# Patient Record
Sex: Male | Born: 1980 | Race: White | Hispanic: No | Marital: Married | Smoking: Smoker, current status unknown
Health system: Southern US, Community
[De-identification: ages and names within clinical notes are randomized; demographics above are authoritative.]

## PROBLEM LIST (undated history)

## (undated) DIAGNOSIS — G4730 Sleep apnea, unspecified: Secondary | ICD-10-CM

---

## 2016-12-30 ENCOUNTER — Inpatient Hospital Stay (HOSPITAL_COMMUNITY): Payer: No Typology Code available for payment source | Admitting: Anesthesiology

## 2016-12-30 ENCOUNTER — Emergency Department (HOSPITAL_COMMUNITY): Payer: No Typology Code available for payment source

## 2016-12-30 ENCOUNTER — Inpatient Hospital Stay (HOSPITAL_COMMUNITY)
Admission: EM | Admit: 2016-12-30 | Discharge: 2017-01-07 | DRG: 480 | Disposition: A | Discharge status: Home Health | Payer: No Typology Code available for payment source | Attending: Orthopedic Surgery | Admitting: Orthopedic Surgery

## 2016-12-30 ENCOUNTER — Inpatient Hospital Stay (HOSPITAL_COMMUNITY): Payer: No Typology Code available for payment source

## 2016-12-30 ENCOUNTER — Encounter (HOSPITAL_COMMUNITY): Payer: Self-pay | Admitting: Radiology

## 2016-12-30 ENCOUNTER — Encounter (HOSPITAL_COMMUNITY): Admission: EM | Disposition: A | Discharge status: Home Health | Payer: Self-pay | Source: Home / Self Care

## 2016-12-30 DIAGNOSIS — S060X9A Concussion with loss of consciousness of unspecified duration, initial encounter: Secondary | ICD-10-CM | POA: Diagnosis present

## 2016-12-30 DIAGNOSIS — S0181XA Laceration without foreign body of other part of head, initial encounter: Secondary | ICD-10-CM

## 2016-12-30 DIAGNOSIS — S299XXA Unspecified injury of thorax, initial encounter: Secondary | ICD-10-CM

## 2016-12-30 DIAGNOSIS — J14 Pneumonia due to Hemophilus influenzae: Secondary | ICD-10-CM | POA: Diagnosis not present

## 2016-12-30 DIAGNOSIS — Z419 Encounter for procedure for purposes other than remedying health state, unspecified: Secondary | ICD-10-CM

## 2016-12-30 DIAGNOSIS — J9601 Acute respiratory failure with hypoxia: Secondary | ICD-10-CM | POA: Diagnosis not present

## 2016-12-30 DIAGNOSIS — S7222XA Displaced subtrochanteric fracture of left femur, initial encounter for closed fracture: Secondary | ICD-10-CM

## 2016-12-30 DIAGNOSIS — J9811 Atelectasis: Secondary | ICD-10-CM

## 2016-12-30 DIAGNOSIS — J45909 Unspecified asthma, uncomplicated: Secondary | ICD-10-CM | POA: Diagnosis present

## 2016-12-30 DIAGNOSIS — E876 Hypokalemia: Secondary | ICD-10-CM | POA: Diagnosis not present

## 2016-12-30 DIAGNOSIS — Y92410 Unspecified street and highway as the place of occurrence of the external cause: Secondary | ICD-10-CM | POA: Diagnosis not present

## 2016-12-30 DIAGNOSIS — S7292XB Unspecified fracture of left femur, initial encounter for open fracture type I or II: Secondary | ICD-10-CM | POA: Diagnosis present

## 2016-12-30 DIAGNOSIS — Z9889 Other specified postprocedural states: Secondary | ICD-10-CM

## 2016-12-30 DIAGNOSIS — S72302C Unspecified fracture of shaft of left femur, initial encounter for open fracture type IIIA, IIIB, or IIIC: Principal | ICD-10-CM | POA: Diagnosis present

## 2016-12-30 DIAGNOSIS — J9690 Respiratory failure, unspecified, unspecified whether with hypoxia or hypercapnia: Secondary | ICD-10-CM

## 2016-12-30 DIAGNOSIS — J189 Pneumonia, unspecified organism: Secondary | ICD-10-CM

## 2016-12-30 DIAGNOSIS — S022XXA Fracture of nasal bones, initial encounter for closed fracture: Secondary | ICD-10-CM | POA: Diagnosis present

## 2016-12-30 DIAGNOSIS — D62 Acute posthemorrhagic anemia: Secondary | ICD-10-CM | POA: Diagnosis not present

## 2016-12-30 DIAGNOSIS — M79652 Pain in left thigh: Secondary | ICD-10-CM | POA: Diagnosis present

## 2016-12-30 HISTORY — PX: FEMUR IM NAIL: SHX1597

## 2016-12-30 HISTORY — DX: Sleep apnea, unspecified: G47.30

## 2016-12-30 LAB — COMPREHENSIVE METABOLIC PANEL
ALBUMIN: 3.7 g/dL (ref 3.5–5.0)
ALT: 41 U/L (ref 17–63)
AST: 49 U/L — AB (ref 15–41)
Alkaline Phosphatase: 64 U/L (ref 38–126)
Anion gap: 6 (ref 5–15)
BUN: 15 mg/dL (ref 6–20)
CHLORIDE: 107 mmol/L (ref 101–111)
CO2: 27 mmol/L (ref 22–32)
CREATININE: 0.65 mg/dL (ref 0.61–1.24)
Calcium: 8.8 mg/dL — ABNORMAL LOW (ref 8.9–10.3)
GFR calc Af Amer: >60 mL/min (ref >60)
GLUCOSE: 143 mg/dL — AB (ref 65–99)
Potassium: 3.3 mmol/L — ABNORMAL LOW (ref 3.5–5.1)
Sodium: 140 mmol/L (ref 135–145)
Total Bilirubin: 0.4 mg/dL (ref 0.3–1.2)
Total Protein: 6 g/dL — ABNORMAL LOW (ref 6.5–8.1)

## 2016-12-30 LAB — CBC
HEMATOCRIT: 40 % (ref 39.0–52.0)
Hemoglobin: 12.9 g/dL — ABNORMAL LOW (ref 13.0–17.0)
MCH: 27.5 pg (ref 26.0–34.0)
MCHC: 32.3 g/dL (ref 30.0–36.0)
MCV: 85.3 fL (ref 78.0–100.0)
Platelets: 181 10*3/uL (ref 150–400)
RBC: 4.69 MIL/uL (ref 4.22–5.81)
RDW: 13.4 % (ref 11.5–15.5)
WBC: 10.4 10*3/uL (ref 4.0–10.5)

## 2016-12-30 LAB — LACTIC ACID, PLASMA: LACTIC ACID, VENOUS: 2.1 mmol/L — AB (ref 0.5–1.9)

## 2016-12-30 LAB — PROTIME-INR
INR: 1.11
Prothrombin Time: 14.3 seconds (ref 11.4–15.2)

## 2016-12-30 LAB — RAPID URINE DRUG SCREEN, HOSP PERFORMED
Amphetamines: POSITIVE — AB
Barbiturates: NOT DETECTED
Benzodiazepines: POSITIVE — AB
Cocaine: NOT DETECTED
Opiates: POSITIVE — AB
TETRAHYDROCANNABINOL: NOT DETECTED

## 2016-12-30 LAB — TYPE AND SCREEN
ABO/RH(D): A NEG
ANTIBODY SCREEN: NEGATIVE

## 2016-12-30 LAB — URINALYSIS, ROUTINE W REFLEX MICROSCOPIC
BILIRUBIN URINE: NEGATIVE
GLUCOSE, UA: NEGATIVE mg/dL
Hgb urine dipstick: NEGATIVE
Ketones, ur: NEGATIVE mg/dL
Leukocytes, UA: NEGATIVE
NITRITE: NEGATIVE
PH: 5 (ref 5.0–8.0)
Protein, ur: NEGATIVE mg/dL

## 2016-12-30 LAB — CBG MONITORING, ED: GLUCOSE-CAPILLARY: 138 mg/dL — AB (ref 65–99)

## 2016-12-30 LAB — ETHANOL: Alcohol, Ethyl (B): <5 mg/dL (ref <5)

## 2016-12-30 LAB — ABO/RH: ABO/RH(D): A NEG

## 2016-12-30 SURGERY — INSERTION, INTRAMEDULLARY ROD, FEMUR
Anesthesia: General | Laterality: Left

## 2016-12-30 MED ORDER — ONDANSETRON HCL 4 MG/2ML IJ SOLN
INTRAMUSCULAR | Status: AC
  Filled 2016-12-30: qty 2

## 2016-12-30 MED ORDER — LIDOCAINE HCL (CARDIAC) 20 MG/ML IV SOLN
INTRAVENOUS | Status: DC | PRN
  Administered 2016-12-30: 100 mg via INTRAVENOUS

## 2016-12-30 MED ORDER — PROPOFOL 10 MG/ML IV BOLUS
INTRAVENOUS | Status: AC
  Filled 2016-12-30: qty 20

## 2016-12-30 MED ORDER — CEFAZOLIN SODIUM 1 G IJ SOLR
INTRAMUSCULAR | Status: DC | PRN
  Administered 2016-12-30: 1 g via INTRAMUSCULAR

## 2016-12-30 MED ORDER — CEFAZOLIN SODIUM-DEXTROSE 2-4 GM/100ML-% IV SOLN: 2.0000 g | Freq: Four times a day (QID) | INTRAVENOUS | Status: DC

## 2016-12-30 MED ORDER — SUGAMMADEX SODIUM 200 MG/2ML IV SOLN
INTRAVENOUS | Status: DC | PRN
  Administered 2016-12-30: 290 mg via INTRAVENOUS

## 2016-12-30 MED ORDER — TETANUS-DIPHTH-ACELL PERTUSSIS 5-2.5-18.5 LF-MCG/0.5 IM SUSP
0.5000 mL | Freq: Once | INTRAMUSCULAR | Status: AC
  Administered 2016-12-30: 0.5 mL via INTRAMUSCULAR
  Filled 2016-12-30: qty 0.5

## 2016-12-30 MED ORDER — HYDROMORPHONE HCL 1 MG/ML IJ SOLN: 0.5000 mg | INTRAMUSCULAR | Status: DC | PRN

## 2016-12-30 MED ORDER — FENTANYL CITRATE (PF) 100 MCG/2ML IJ SOLN
INTRAMUSCULAR | Status: AC
  Filled 2016-12-30: qty 2

## 2016-12-30 MED ORDER — HYDROMORPHONE HCL 1 MG/ML IJ SOLN: 0.2500 mg | INTRAMUSCULAR | Status: DC | PRN

## 2016-12-30 MED ORDER — HYDROMORPHONE HCL 1 MG/ML IJ SOLN
INTRAMUSCULAR | Status: AC
  Filled 2016-12-30: qty 1

## 2016-12-30 MED ORDER — ONDANSETRON HCL 4 MG/2ML IJ SOLN: 4.0000 mg | Freq: Four times a day (QID) | INTRAMUSCULAR | Status: DC | PRN

## 2016-12-30 MED ORDER — HYDROMORPHONE HCL 1 MG/ML IJ SOLN: 1.0000 mg | Freq: Once | INTRAMUSCULAR | Status: DC

## 2016-12-30 MED ORDER — CEFAZOLIN SODIUM 1 G IJ SOLR
INTRAMUSCULAR | Status: AC
  Filled 2016-12-30: qty 10

## 2016-12-30 MED ORDER — ONDANSETRON HCL 4 MG/2ML IJ SOLN
INTRAMUSCULAR | Status: DC | PRN
  Administered 2016-12-30: 4 mg via INTRAVENOUS

## 2016-12-30 MED ORDER — POVIDONE-IODINE 10 % EX SWAB: 2.0000 "application " | Freq: Once | CUTANEOUS | Status: DC

## 2016-12-30 MED ORDER — CEFAZOLIN SODIUM-DEXTROSE 2-4 GM/100ML-% IV SOLN
2.0000 g | Freq: Once | INTRAVENOUS | Status: AC
Start: 2016-12-30 — End: 2016-12-30
  Administered 2016-12-30: 2 g via INTRAVENOUS
  Filled 2016-12-30: qty 100

## 2016-12-30 MED ORDER — SODIUM CHLORIDE 0.9 % IR SOLN
Status: DC | PRN
  Administered 2016-12-30: 3000 mL

## 2016-12-30 MED ORDER — 0.9 % SODIUM CHLORIDE (POUR BTL) OPTIME
TOPICAL | Status: DC | PRN
  Administered 2016-12-30: 1000 mL

## 2016-12-30 MED ORDER — ACETAMINOPHEN 325 MG PO TABS
650.0000 mg | ORAL_TABLET | Freq: Four times a day (QID) | ORAL | Status: DC | PRN
Start: 2016-12-30 — End: 2017-01-05
  Administered 2017-01-02: 650 mg via ORAL
  Filled 2016-12-30: qty 2

## 2016-12-30 MED ORDER — CEFAZOLIN IN D5W 1 GM/50ML IV SOLN
1.0000 g | Freq: Three times a day (TID) | INTRAVENOUS | Status: DC
  Administered 2016-12-31 – 2017-01-01 (×4): 1 g via INTRAVENOUS
  Filled 2016-12-30 (×6): qty 50

## 2016-12-30 MED ORDER — CHLORHEXIDINE GLUCONATE 4 % EX LIQD
60.0000 mL | Freq: Once | CUTANEOUS | Status: DC
  Filled 2016-12-30: qty 60

## 2016-12-30 MED ORDER — METOCLOPRAMIDE HCL 10 MG PO TABS
5.0000 mg | ORAL_TABLET | Freq: Three times a day (TID) | ORAL | Status: DC | PRN
Start: 2016-12-30 — End: 2017-01-07
  Filled 2016-12-30: qty 1

## 2016-12-30 MED ORDER — PANTOPRAZOLE SODIUM 40 MG IV SOLR
40.0000 mg | Freq: Every day | INTRAVENOUS | Status: DC
  Administered 2016-12-31 – 2017-01-03 (×4): 40 mg via INTRAVENOUS
  Filled 2016-12-30 (×4): qty 40

## 2016-12-30 MED ORDER — KCL IN DEXTROSE-NACL 20-5-0.45 MEQ/L-%-% IV SOLN
INTRAVENOUS | Status: DC
  Administered 2016-12-31: 100 mL/h via INTRAVENOUS
  Administered 2017-01-01: 16:00:00 via INTRAVENOUS
  Administered 2017-01-02 (×3): 100 mL/h via INTRAVENOUS
  Administered 2017-01-03 (×2): via INTRAVENOUS
  Administered 2017-01-04 (×2): 100 mL/h via INTRAVENOUS
  Administered 2017-01-06: 19:00:00 via INTRAVENOUS
  Filled 2016-12-30 (×15): qty 1000

## 2016-12-30 MED ORDER — SUGAMMADEX SODIUM 200 MG/2ML IV SOLN
INTRAVENOUS | Status: AC
  Filled 2016-12-30: qty 4

## 2016-12-30 MED ORDER — ENOXAPARIN SODIUM 40 MG/0.4ML ~~LOC~~ SOLN
40.0000 mg | SUBCUTANEOUS | Status: DC
  Administered 2016-12-31 – 2017-01-07 (×8): 40 mg via SUBCUTANEOUS
  Filled 2016-12-30 (×8): qty 0.4

## 2016-12-30 MED ORDER — HYDROMORPHONE HCL 1 MG/ML IJ SOLN
1.0000 mg | INTRAMUSCULAR | Status: DC | PRN
  Administered 2016-12-31 – 2017-01-05 (×12): 1 mg via INTRAVENOUS
  Filled 2016-12-30 (×12): qty 1

## 2016-12-30 MED ORDER — CEFAZOLIN SODIUM-DEXTROSE 2-4 GM/100ML-% IV SOLN
2.0000 g | INTRAVENOUS | Status: AC
  Administered 2016-12-30: 2 g via INTRAVENOUS
  Filled 2016-12-30 (×3): qty 100

## 2016-12-30 MED ORDER — NALOXONE HCL 0.4 MG/ML IJ SOLN
INTRAMUSCULAR | Status: AC
  Filled 2016-12-30: qty 1

## 2016-12-30 MED ORDER — ALBUMIN HUMAN 5 % IV SOLN
INTRAVENOUS | Status: DC | PRN
  Administered 2016-12-30: 18:00:00 via INTRAVENOUS

## 2016-12-30 MED ORDER — ONDANSETRON HCL 4 MG PO TABS: 4.0000 mg | ORAL_TABLET | Freq: Four times a day (QID) | ORAL | Status: DC | PRN

## 2016-12-30 MED ORDER — LACTATED RINGERS IV SOLN
INTRAVENOUS | Status: DC | PRN
  Administered 2016-12-30: 18:00:00 via INTRAVENOUS

## 2016-12-30 MED ORDER — FENTANYL CITRATE (PF) 100 MCG/2ML IJ SOLN
INTRAMUSCULAR | Status: DC | PRN
  Administered 2016-12-30: 50 ug via INTRAVENOUS
  Administered 2016-12-30: 150 ug via INTRAVENOUS

## 2016-12-30 MED ORDER — IOPAMIDOL (ISOVUE-300) INJECTION 61%
INTRAVENOUS | Status: AC
  Administered 2016-12-30: 100 mL
  Filled 2016-12-30: qty 100

## 2016-12-30 MED ORDER — PROMETHAZINE HCL 25 MG/ML IJ SOLN
6.2500 mg | INTRAMUSCULAR | Status: DC | PRN
Start: 2016-12-30 — End: 2016-12-30

## 2016-12-30 MED ORDER — PROPOFOL 10 MG/ML IV BOLUS
INTRAVENOUS | Status: DC | PRN
  Administered 2016-12-30: 200 mg via INTRAVENOUS

## 2016-12-30 MED ORDER — SUCCINYLCHOLINE CHLORIDE 20 MG/ML IJ SOLN
INTRAMUSCULAR | Status: DC | PRN
  Administered 2016-12-30: 160 mg via INTRAVENOUS

## 2016-12-30 MED ORDER — PANTOPRAZOLE SODIUM 40 MG PO TBEC
40.0000 mg | DELAYED_RELEASE_TABLET | Freq: Every day | ORAL | Status: DC
  Administered 2017-01-04 – 2017-01-07 (×4): 40 mg via ORAL
  Filled 2016-12-30 (×4): qty 1

## 2016-12-30 MED ORDER — ROCURONIUM BROMIDE 100 MG/10ML IV SOLN
INTRAVENOUS | Status: DC | PRN
  Administered 2016-12-30: 10 mg via INTRAVENOUS
  Administered 2016-12-30: 50 mg via INTRAVENOUS
  Administered 2016-12-30 (×2): 20 mg via INTRAVENOUS
  Administered 2016-12-30: 50 mg via INTRAVENOUS

## 2016-12-30 MED ORDER — LIDOCAINE-EPINEPHRINE (PF) 2 %-1:200000 IJ SOLN
20.0000 mL | Freq: Once | INTRAMUSCULAR | Status: AC
  Administered 2016-12-30: 20 mL
  Filled 2016-12-30: qty 20

## 2016-12-30 MED ORDER — LACTATED RINGERS IV SOLN
INTRAVENOUS | Status: DC
  Administered 2016-12-30: 16:00:00 via INTRAVENOUS

## 2016-12-30 MED ORDER — MIDAZOLAM HCL 2 MG/2ML IJ SOLN
INTRAMUSCULAR | Status: AC
  Filled 2016-12-30: qty 2

## 2016-12-30 MED ORDER — METOCLOPRAMIDE HCL 5 MG/ML IJ SOLN: 5.0000 mg | Freq: Three times a day (TID) | INTRAMUSCULAR | Status: DC | PRN

## 2016-12-30 MED ORDER — PHENYLEPHRINE HCL 10 MG/ML IJ SOLN
INTRAVENOUS | Status: DC | PRN
  Administered 2016-12-30: 20 ug/min via INTRAVENOUS

## 2016-12-30 MED ORDER — ROCURONIUM BROMIDE 50 MG/5ML IV SOSY
PREFILLED_SYRINGE | INTRAVENOUS | Status: AC
  Filled 2016-12-30: qty 5

## 2016-12-30 MED ORDER — ACETAMINOPHEN 650 MG RE SUPP
650.0000 mg | Freq: Four times a day (QID) | RECTAL | Status: DC | PRN
  Administered 2016-12-31 – 2017-01-02 (×4): 650 mg via RECTAL
  Filled 2016-12-30 (×4): qty 1

## 2016-12-30 SURGICAL SUPPLY — 49 items
ALCOHOL 70% 16 OZ (MISCELLANEOUS) ×3 IMPLANT
BIT DRILL 3.8X8 NS (BIT) ×6 IMPLANT
BIT DRILL 6.5X4.8 (BIT) ×3 IMPLANT
BNDG COHESIVE 4X5 TAN STRL (GAUZE/BANDAGES/DRESSINGS) ×3 IMPLANT
BNDG COHESIVE 6X5 TAN STRL LF (GAUZE/BANDAGES/DRESSINGS) ×6 IMPLANT
COVER MAYO STAND STRL (DRAPES) ×6 IMPLANT
COVER PERINEAL POST (MISCELLANEOUS) ×3 IMPLANT
COVER SURGICAL LIGHT HANDLE (MISCELLANEOUS) ×3 IMPLANT
DRAPE C-ARMOR (DRAPES) ×3 IMPLANT
DRAPE INCISE IOBAN 66X45 STRL (DRAPES) IMPLANT
DRAPE ORTHO SPLIT 77X108 STRL (DRAPES) ×4
DRAPE PROXIMA HALF (DRAPES) ×3 IMPLANT
DRAPE STERI IOBAN 125X83 (DRAPES) ×3 IMPLANT
DRAPE SURG ORHT 6 SPLT 77X108 (DRAPES) ×2 IMPLANT
DRSG ADAPTIC 3X8 NADH LF (GAUZE/BANDAGES/DRESSINGS) ×3 IMPLANT
DRSG TEGADERM 2-3/8X2-3/4 SM (GAUZE/BANDAGES/DRESSINGS) IMPLANT
DRSG TEGADERM 4X4.75 (GAUZE/BANDAGES/DRESSINGS) IMPLANT
DURAPREP 26ML APPLICATOR (WOUND CARE) ×3 IMPLANT
ELECT REM PT RETURN 9FT ADLT (ELECTROSURGICAL) ×3
ELECTRODE REM PT RTRN 9FT ADLT (ELECTROSURGICAL) ×1 IMPLANT
FACESHIELD WRAPAROUND (MASK) IMPLANT
GAUZE SPONGE 4X4 12PLY STRL (GAUZE/BANDAGES/DRESSINGS) ×3 IMPLANT
GAUZE XEROFORM 1X8 LF (GAUZE/BANDAGES/DRESSINGS) IMPLANT
GLOVE BIO SURGEON STRL SZ7.5 (GLOVE) ×6 IMPLANT
GLOVE BIOGEL PI IND STRL 8 (GLOVE) ×1 IMPLANT
GLOVE BIOGEL PI INDICATOR 8 (GLOVE) ×2
GOWN STRL REUS W/ TWL LRG LVL3 (GOWN DISPOSABLE) ×4 IMPLANT
GOWN STRL REUS W/ TWL XL LVL3 (GOWN DISPOSABLE) ×1 IMPLANT
GOWN STRL REUS W/TWL LRG LVL3 (GOWN DISPOSABLE) ×8
GOWN STRL REUS W/TWL XL LVL3 (GOWN DISPOSABLE) ×2
GUIDEWIRE BALL NOSE 100CM (WIRE) ×3 IMPLANT
KIT BASIN OR (CUSTOM PROCEDURE TRAY) ×3 IMPLANT
KIT ROOM TURNOVER OR (KITS) ×3 IMPLANT
MANIFOLD NEPTUNE II (INSTRUMENTS) ×3 IMPLANT
NAIL VERSA TROCH LEFT 460MML (Orthopedic Implant) ×3 IMPLANT
NS IRRIG 1000ML POUR BTL (IV SOLUTION) ×3 IMPLANT
PACK GENERAL/GYN (CUSTOM PROCEDURE TRAY) ×3 IMPLANT
PAD ARMBOARD 7.5X6 YLW CONV (MISCELLANEOUS) ×6 IMPLANT
PIN GUIDE 3.2 903003004 (MISCELLANEOUS) ×9 IMPLANT
SCREW ACECAP 60MM (Screw) ×3 IMPLANT
SCREW CANCELLOUS 6.5X110 (Screw) ×3 IMPLANT
SCREW LAG 6.5MMX85MM (Screw) ×3 IMPLANT
SCREW LOCK (Screw) ×2 IMPLANT
SCREW LOCK FT 75X4.5XSLD ST NS (Screw) ×1 IMPLANT
SCREWDRIVER HEX TIP 3.5MM (MISCELLANEOUS) ×3 IMPLANT
SPONGE GAUZE 4X4 12PLY STER LF (GAUZE/BANDAGES/DRESSINGS) IMPLANT
STAPLER VISISTAT 35W (STAPLE) IMPLANT
SUT VIC AB 2-0 CT1 27 (SUTURE) ×2
SUT VIC AB 2-0 CT1 TAPERPNT 27 (SUTURE) ×1 IMPLANT

## 2016-12-30 NOTE — Progress Notes (Signed)
Orthopedic Tech Progress Note Patient Details:  Roy Alvarado 04/21/1981 [id number]  Patient ID: Roy Alvarado, male   DOB: 10/20/1980, 36 y.o.   MRN: [id number]   Saul FordyceJennifer C Kathlyne Loud 12/30/2016, 11:01 AMLevel 2 Trauma.

## 2016-12-30 NOTE — Anesthesia Postprocedure Evaluation (Signed)
Anesthesia Post Note  Patient: Roy Alvarado  Procedure(s) Performed: Procedure(s) (LRB): INTRAMEDULLARY (IM) NAIL FEMORAL (Left)  Patient location during evaluation: PACU Anesthesia Type: General Level of consciousness: obtunded/minimal responses and responds to stimulation Pain management: pain level controlled Vital Signs Assessment: post-procedure vital signs reviewed and stable Respiratory status: spontaneous breathing, nonlabored ventilation and patient connected to face mask oxygen Cardiovascular status: blood pressure returned to baseline, stable and tachycardic Postop Assessment: no signs of nausea or vomiting Anesthetic complications: no Comments: Discussed with PACU RN.  Patient minimally responsive pre-procedure.  Call to trauma surgery attending to get patient admitted to ICU overnight for continued monitoring.       Last Vitals:  Vitals:   12/30/16 2145 12/30/16 2200  BP: (!) 150/83 (!) 156/79  Pulse: (!) 106 (!) 111  Resp: (!) 24 (!) 23  Temp:  37.4 C    Last Pain:  Vitals:   12/30/16 2105  TempSrc:   PainSc: Asleep                 Cecile HearingStephen Edward Turk

## 2016-12-30 NOTE — Anesthesia Preprocedure Evaluation (Addendum)
Anesthesia Evaluation  Patient identified by MRN, date of birth, ID band Patient awake    Reviewed: Allergy & Precautions, NPO status , Patient's Chart, lab work & pertinent test results  Airway Mallampati: I  TM Distance: >3 FB Neck ROM: Full    Dental   Pulmonary asthma ,    Pulmonary exam normal        Cardiovascular Normal cardiovascular exam     Neuro/Psych Obtunded. Opiate Benzodiazepine Amphetamine positive on drug screen. Normal CT  Spoke to Dr Geanie Loganhompson Ok to proceed    GI/Hepatic   Endo/Other    Renal/GU      Musculoskeletal   Abdominal   Peds  Hematology   Anesthesia Other Findings   Reproductive/Obstetrics                            Anesthesia Physical Anesthesia Plan  ASA: III  Anesthesia Plan: General   Post-op Pain Management:    Induction: Intravenous  Airway Management Planned: Oral ETT  Additional Equipment:   Intra-op Plan:   Post-operative Plan: Extubation in OR  Informed Consent: I have reviewed the patients History and Physical, chart, labs and discussed the procedure including the risks, benefits and alternatives for the proposed anesthesia with the patient or authorized representative who has indicated his/her understanding and acceptance.     Plan Discussed with: CRNA and Surgeon  Anesthesia Plan Comments:         Anesthesia Quick Evaluation

## 2016-12-30 NOTE — Anesthesia Procedure Notes (Signed)
Procedure Name: Intubation Date/Time: 12/30/2016 5:50 PM Performed by: Rosiland OzMEYERS, Sofia Vanmeter Pre-anesthesia Checklist: Patient identified, Emergency Drugs available, Suction available, Patient being monitored and Timeout performed Patient Re-evaluated:Patient Re-evaluated prior to inductionOxygen Delivery Method: Circle system utilized Preoxygenation: Pre-oxygenation with 100% oxygen Intubation Type: IV induction, Rapid sequence and Cricoid Pressure applied Laryngoscope Size: Miller and 3 Grade View: Grade I Tube type: Oral Tube size: 8.0 mm Number of attempts: 1 Airway Equipment and Method: Stylet Placement Confirmation: ETT inserted through vocal cords under direct vision,  positive ETCO2 and breath sounds checked- equal and bilateral Secured at: 24 cm Tube secured with: Tape Dental Injury: Teeth and Oropharynx as per pre-operative assessment
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
## 2016-12-30 NOTE — ED Notes (Signed)
Family updated as to patient's status -- spoke with wife on phone-- still at work-- will come to hospital this afternoon.

## 2016-12-30 NOTE — Brief Op Note (Signed)
12/30/2016  8:58 PM  PATIENT:  Kalman JewelsJoshua Pethtel  36 y.o. male  PRE-OPERATIVE DIAGNOSIS:  femur fx  POST-OPERATIVE DIAGNOSIS:  Femur Fx  PROCEDURE:  Procedure(s): INTRAMEDULLARY (IM) NAIL FEMORAL (Left)  SURGEON:  Surgeon(s) and Role:    * Yolonda KidaJason Patrick Rogers, MD - Primary  PHYSICIAN ASSISTANT:   ASSISTANTS: none   ANESTHESIA:   general  EBL:  Total I/O In: 1350 [I.V.:1100; IV Piggyback:250] Out: 200 [Blood:200]  BLOOD ADMINISTERED:none  DRAINS: none   LOCAL MEDICATIONS USED:  NONE  SPECIMEN:  No Specimen  DISPOSITION OF SPECIMEN:  N/A  COUNTS:  YES  TOURNIQUET:  * No tourniquets in log *  DICTATION: .Note written in EPIC  PLAN OF CARE: Admit to inpatient   PATIENT DISPOSITION:  PACU - hemodynamically stable.   Delay start of Pharmacological VTE agent (>24hrs) due to surgical blood loss or risk of bleeding: not applicable

## 2016-12-30 NOTE — Transfer of Care (Signed)
Immediate Anesthesia Transfer of Care Note  Patient: Kalman JewelsJoshua Daus  Procedure(s) Performed: Procedure(s): INTRAMEDULLARY (IM) NAIL FEMORAL (Left)  Patient Location: PACU  Anesthesia Type:General  Level of Consciousness: sedated  Airway & Oxygen Therapy: Patient Spontanous Breathing and Patient connected to nasal cannula oxygen  Post-op Assessment: Report given to RN and Post -op Vital signs reviewed and stable  Post vital signs: Reviewed and stable  Last Vitals:  Vitals:   12/30/16 1500 12/30/16 1555  BP: (!) 152/70 (!) 142/68  Pulse: (!) 106 97  Resp: (!) 23   Temp:  36.8 C    Last Pain:  Vitals:   12/30/16 1555  TempSrc: Axillary  PainSc:          Complications: No apparent anesthesia complications
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
## 2016-12-30 NOTE — Progress Notes (Signed)
Patient ID: Kalman JewelsJoshua Thier, male   DOB: 03/08/1981, 36 y.o.   MRN: [id number] Still very sleepy per RN. Will change admit to SDU with Q2h neuro checks.  Violeta GelinasBurke Temara Lanum, MD, MPH, FACS Trauma: [phone] General Surgery: [phone]

## 2016-12-30 NOTE — ED Triage Notes (Signed)
See trauma notes
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
## 2016-12-31 ENCOUNTER — Inpatient Hospital Stay (HOSPITAL_COMMUNITY): Payer: No Typology Code available for payment source

## 2016-12-31 ENCOUNTER — Inpatient Hospital Stay (HOSPITAL_COMMUNITY): Payer: No Typology Code available for payment source | Admitting: Anesthesiology

## 2016-12-31 ENCOUNTER — Encounter (HOSPITAL_COMMUNITY): Payer: Self-pay | Admitting: Orthopedic Surgery

## 2016-12-31 DIAGNOSIS — M79652 Pain in left thigh: Secondary | ICD-10-CM | POA: Diagnosis not present

## 2016-12-31 DIAGNOSIS — S72302C Unspecified fracture of shaft of left femur, initial encounter for open fracture type IIIA, IIIB, or IIIC: Secondary | ICD-10-CM | POA: Diagnosis not present

## 2016-12-31 LAB — POCT I-STAT 3, ART BLOOD GAS (G3+)
ACID-BASE EXCESS: 5 mmol/L — AB (ref 0.0–2.0)
ACID-BASE EXCESS: 3 mmol/L — AB (ref 0.0–2.0)
Acid-Base Excess: 1 mmol/L (ref 0.0–2.0)
Acid-Base Excess: 2 mmol/L (ref 0.0–2.0)
Acid-Base Excess: 5 mmol/L — ABNORMAL HIGH (ref 0.0–2.0)
BICARBONATE: 29.2 mmol/L — AB (ref 20.0–28.0)
BICARBONATE: 30.5 mmol/L — AB (ref 20.0–28.0)
Bicarbonate: 32.3 mmol/L — ABNORMAL HIGH (ref 20.0–28.0)
Bicarbonate: 30.4 mmol/L — ABNORMAL HIGH (ref 20.0–28.0)
Bicarbonate: 28.4 mmol/L — ABNORMAL HIGH (ref 20.0–28.0)
O2 SAT: 87 %
O2 Saturation: 79 %
O2 Saturation: 86 %
O2 Saturation: 91 %
O2 Saturation: 94 %
PCO2 ART: 85.7 mmHg — AB (ref 32.0–48.0)
PCO2 ART: 50.3 mmHg — AB (ref 32.0–48.0)
PCO2 ART: 44.4 mmHg (ref 32.0–48.0)
PH ART: 7.26 — AB (ref 7.350–7.450)
PH ART: 7.389 (ref 7.350–7.450)
PH ART: 7.42 (ref 7.350–7.450)
PO2 ART: 43 mmHg — AB (ref 83.0–108.0)
PO2 ART: 62 mmHg — AB (ref 83.0–108.0)
PO2 ART: 65 mmHg — AB (ref 83.0–108.0)
PO2 ART: 70 mmHg — AB (ref 83.0–108.0)
PO2 ART: 71 mmHg — AB (ref 83.0–108.0)
Patient temperature: 100.3
Patient temperature: 99.3
TCO2: 30 mmol/L (ref 0–100)
TCO2: 31 mmol/L (ref 0–100)
TCO2: 32 mmol/L (ref 0–100)
TCO2: 32 mmol/L (ref 0–100)
TCO2: 35 mmol/L (ref 0–100)
pCO2 arterial: 47 mmHg (ref 32.0–48.0)
pCO2 arterial: 65.6 mmHg (ref 32.0–48.0)
pH, Arterial: 7.186 — CL (ref 7.350–7.450)
pH, Arterial: 7.414 (ref 7.350–7.450)

## 2016-12-31 LAB — BASIC METABOLIC PANEL
Anion gap: 9 (ref 5–15)
BUN: 13 mg/dL (ref 6–20)
CALCIUM: 8.4 mg/dL — AB (ref 8.9–10.3)
CO2: 28 mmol/L (ref 22–32)
Chloride: 101 mmol/L (ref 101–111)
Creatinine, Ser: 0.63 mg/dL (ref 0.61–1.24)
GFR calc non Af Amer: >60 mL/min (ref >60)
GLUCOSE: 139 mg/dL — AB (ref 65–99)
Potassium: 4.1 mmol/L (ref 3.5–5.1)
Sodium: 138 mmol/L (ref 135–145)

## 2016-12-31 LAB — CBC
HCT: 35.2 % — ABNORMAL LOW (ref 39.0–52.0)
HEMATOCRIT: 30.4 % — AB (ref 39.0–52.0)
HEMOGLOBIN: 9.7 g/dL — AB (ref 13.0–17.0)
Hemoglobin: 11.5 g/dL — ABNORMAL LOW (ref 13.0–17.0)
MCH: 28 pg (ref 26.0–34.0)
MCH: 27.1 pg (ref 26.0–34.0)
MCHC: 32.7 g/dL (ref 30.0–36.0)
MCHC: 31.9 g/dL (ref 30.0–36.0)
MCV: 85.9 fL (ref 78.0–100.0)
MCV: 84.9 fL (ref 78.0–100.0)
Platelets: 226 10*3/uL (ref 150–400)
Platelets: 167 10*3/uL (ref 150–400)
RBC: 4.1 MIL/uL — ABNORMAL LOW (ref 4.22–5.81)
RBC: 3.58 MIL/uL — ABNORMAL LOW (ref 4.22–5.81)
RDW: 13.9 % (ref 11.5–15.5)
RDW: 13.8 % (ref 11.5–15.5)
WBC: 17.9 10*3/uL — AB (ref 4.0–10.5)
WBC: 9.5 10*3/uL (ref 4.0–10.5)

## 2016-12-31 LAB — BLOOD GAS, ARTERIAL
Acid-Base Excess: 5 mmol/L — ABNORMAL HIGH (ref 0.0–2.0)
Bicarbonate: 30.4 mmol/L — ABNORMAL HIGH (ref 20.0–28.0)
DRAWN BY: 249101
FIO2: 50
LHR: 16 {breaths}/min
MECHVT: 800 mL
O2 Saturation: 94.6 %
PATIENT TEMPERATURE: 98.6
PCO2 ART: 57.2 mmHg — AB (ref 32.0–48.0)
PEEP: 5 cmH2O
PO2 ART: 74.1 mmHg — AB (ref 83.0–108.0)
pH, Arterial: 7.345 — ABNORMAL LOW (ref 7.350–7.450)

## 2016-12-31 LAB — HIV ANTIBODY (ROUTINE TESTING W REFLEX): HIV SCREEN 4TH GENERATION: NONREACTIVE

## 2016-12-31 LAB — TRIGLYCERIDES: Triglycerides: 43 mg/dL (ref <150)

## 2016-12-31 MED ORDER — FENTANYL CITRATE (PF) 100 MCG/2ML IJ SOLN
INTRAMUSCULAR | Status: AC
  Administered 2016-12-31: 100 ug
  Filled 2016-12-31: qty 4

## 2016-12-31 MED ORDER — PROPOFOL 10 MG/ML IV BOLUS
INTRAVENOUS | Status: DC | PRN
  Administered 2016-12-31: 100 mg via INTRAVENOUS

## 2016-12-31 MED ORDER — SUCCINYLCHOLINE CHLORIDE 20 MG/ML IJ SOLN
INTRAMUSCULAR | Status: DC | PRN
  Administered 2016-12-31: 140 mg via INTRAVENOUS

## 2016-12-31 MED ORDER — ROCURONIUM BROMIDE 100 MG/10ML IV SOLN
INTRAVENOUS | Status: DC | PRN
  Administered 2016-12-31: 50 mg via INTRAVENOUS

## 2016-12-31 MED ORDER — CHLORHEXIDINE GLUCONATE 0.12% ORAL RINSE (MEDLINE KIT)
15.0000 mL | Freq: Two times a day (BID) | OROMUCOSAL | Status: DC
  Administered 2016-12-31 – 2017-01-04 (×9): 15 mL via OROMUCOSAL

## 2016-12-31 MED ORDER — ORAL CARE MOUTH RINSE
15.0000 mL | Freq: Four times a day (QID) | OROMUCOSAL | Status: DC
  Administered 2016-12-31 – 2017-01-04 (×18): 15 mL via OROMUCOSAL

## 2016-12-31 MED ORDER — ALBUMIN HUMAN 5 % IV SOLN
25.0000 g | Freq: Once | INTRAVENOUS | Status: AC
  Administered 2016-12-31: 25 g via INTRAVENOUS
  Filled 2016-12-31: qty 500

## 2016-12-31 MED ORDER — PROPOFOL 1000 MG/100ML IV EMUL
5.0000 ug/kg/min | INTRAVENOUS | Status: DC
  Administered 2016-12-31 (×2): 40 ug/kg/min via INTRAVENOUS
  Administered 2016-12-31: 5 ug/kg/min via INTRAVENOUS
  Administered 2016-12-31: 40 ug/kg/min via INTRAVENOUS
  Administered 2016-12-31: 30 ug/kg/min via INTRAVENOUS
  Administered 2016-12-31 (×2): 40 ug/kg/min via INTRAVENOUS
  Administered 2017-01-01 (×2): 50 ug/kg/min via INTRAVENOUS
  Administered 2017-01-01 (×2): 40 ug/kg/min via INTRAVENOUS
  Administered 2017-01-01 (×3): 50 ug/kg/min via INTRAVENOUS
  Administered 2017-01-01: 35 ug/kg/min via INTRAVENOUS
  Administered 2017-01-01: 50 ug/kg/min via INTRAVENOUS
  Administered 2017-01-02: 70 ug/kg/min via INTRAVENOUS
  Filled 2016-12-31 (×6): qty 100
  Filled 2016-12-31: qty 1000
  Filled 2016-12-31 (×7): qty 100

## 2016-12-31 MED ORDER — MIDAZOLAM HCL 2 MG/2ML IJ SOLN
INTRAMUSCULAR | Status: AC
  Administered 2016-12-31: 4 mg
  Filled 2016-12-31: qty 4
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
## 2016-12-31 NOTE — Transfer of Care (Signed)
Ad hoc intubation    Last Vitals:  Vitals:   12/31/16 0100 12/31/16 0256  BP: 137/66   Pulse: (!) 127 (!) 130  Resp: (!) 24 16  Temp:      Last Pain:  Vitals:   12/30/16 2220  TempSrc:   PainSc: Asleep         Complications: No apparent anesthesia complications

## 2016-12-31 NOTE — Progress Notes (Signed)
Called by pacu nurse initially; pt remains somnolent - was like that preop. Changed bed status to ICU.  Called by ICU nurse. Pt still somnolent, on NRB. abg shows pco2 60s, paO2 60. Tachy and HTN. About 700cc in bladder.   Ordered bipap, foley insertion. Repeat abg 1hr after bipap started. If resp status not improved, pt will require intubation. Am labs ordered for now  Mary Sellaric M. Andrey CampanileWilson, MD, FACS General, Bariatric, & Minimally Invasive Surgery [facility] [place]
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
## 2016-12-31 NOTE — Anesthesia Procedure Notes (Signed)
Procedure Name: Intubation Date/Time: 12/31/2016 2:40 AM Performed by: Nicholos JohnsMCPHAIL, Crystie Yanko S Pre-anesthesia Checklist: Patient identified, Emergency Drugs available, Suction available, Patient being monitored and Timeout performed Patient Re-evaluated:Patient Re-evaluated prior to inductionOxygen Delivery Method: Ambu bag Preoxygenation: Pre-oxygenation with 100% oxygen Intubation Type: IV induction Laryngoscope size: planned glidescope. Tube type: Subglottic suction tube Tube size: 7.5 mm Number of attempts: 1 Airway Equipment and Method: Video-laryngoscopy Placement Confirmation: ETT inserted through vocal cords under direct vision,  CO2 detector and breath sounds checked- equal and bilateral Secured at: 23 cm Tube secured with: RT securement device. Dental Injury: Teeth and Oropharynx as per pre-operative assessment

## 2017-01-01 ENCOUNTER — Inpatient Hospital Stay (HOSPITAL_COMMUNITY): Payer: No Typology Code available for payment source

## 2017-01-01 LAB — BLOOD GAS, ARTERIAL
Acid-Base Excess: 6.8 mmol/L — ABNORMAL HIGH (ref 0.0–2.0)
Bicarbonate: 30.7 mmol/L — ABNORMAL HIGH (ref 20.0–28.0)
Drawn by: 252031
FIO2: 60
O2 SAT: 91.9 %
PEEP: 8 cmH2O
PH ART: 7.427 (ref 7.350–7.450)
Patient temperature: 103
RATE: 18 resp/min
VT: 800 mL
pCO2 arterial: 48.8 mmHg — ABNORMAL HIGH (ref 32.0–48.0)
pO2, Arterial: 70.7 mmHg — ABNORMAL LOW (ref 83.0–108.0)

## 2017-01-01 LAB — CBC WITH DIFFERENTIAL/PLATELET
Basophils Absolute: 0 10*3/uL (ref 0.0–0.1)
Basophils Relative: 0 %
EOS ABS: 0.1 10*3/uL (ref 0.0–0.7)
Eosinophils Relative: 1 %
HCT: 31.5 % — ABNORMAL LOW (ref 39.0–52.0)
Hemoglobin: 10.2 g/dL — ABNORMAL LOW (ref 13.0–17.0)
LYMPHS ABS: 1.5 10*3/uL (ref 0.7–4.0)
LYMPHS PCT: 14 %
MCH: 27.9 pg (ref 26.0–34.0)
MCHC: 32.4 g/dL (ref 30.0–36.0)
MCV: 86.3 fL (ref 78.0–100.0)
MONO ABS: 1.2 10*3/uL — AB (ref 0.1–1.0)
Monocytes Relative: 11 %
Neutro Abs: 7.8 10*3/uL — ABNORMAL HIGH (ref 1.7–7.7)
Neutrophils Relative %: 74 %
PLATELETS: 146 10*3/uL — AB (ref 150–400)
RBC: 3.65 MIL/uL — AB (ref 4.22–5.81)
RDW: 13.9 % (ref 11.5–15.5)
WBC: 10.5 10*3/uL (ref 4.0–10.5)

## 2017-01-01 LAB — BASIC METABOLIC PANEL
Anion gap: 7 (ref 5–15)
BUN: 13 mg/dL (ref 6–20)
CALCIUM: 8.4 mg/dL — AB (ref 8.9–10.3)
CO2: 31 mmol/L (ref 22–32)
CREATININE: 0.68 mg/dL (ref 0.61–1.24)
Chloride: 99 mmol/L — ABNORMAL LOW (ref 101–111)
GFR calc Af Amer: >60 mL/min (ref >60)
GLUCOSE: 122 mg/dL — AB (ref 65–99)
Potassium: 3.8 mmol/L (ref 3.5–5.1)
SODIUM: 137 mmol/L (ref 135–145)

## 2017-01-01 LAB — URINALYSIS, ROUTINE W REFLEX MICROSCOPIC
BILIRUBIN URINE: NEGATIVE
GLUCOSE, UA: NEGATIVE mg/dL
HGB URINE DIPSTICK: NEGATIVE
Ketones, ur: NEGATIVE mg/dL
LEUKOCYTES UA: NEGATIVE
NITRITE: NEGATIVE
Protein, ur: 100 mg/dL — AB
SPECIFIC GRAVITY, URINE: 1.04 — AB (ref 1.005–1.030)
pH: 5 (ref 5.0–8.0)

## 2017-01-01 MED ORDER — PIPERACILLIN-TAZOBACTAM 3.375 G IVPB
3.3750 g | Freq: Three times a day (TID) | INTRAVENOUS | Status: DC
  Administered 2017-01-01 – 2017-01-05 (×14): 3.375 g via INTRAVENOUS
  Filled 2017-01-01 (×14): qty 50

## 2017-01-01 MED ORDER — VANCOMYCIN HCL 10 G IV SOLR
2000.0000 mg | Freq: Once | INTRAVENOUS | Status: AC
  Administered 2017-01-01: 2000 mg via INTRAVENOUS
  Filled 2017-01-01: qty 2000

## 2017-01-01 MED ORDER — VANCOMYCIN HCL 10 G IV SOLR
1250.0000 mg | Freq: Three times a day (TID) | INTRAVENOUS | Status: DC
  Administered 2017-01-01 – 2017-01-03 (×6): 1250 mg via INTRAVENOUS
  Filled 2017-01-01 (×6): qty 1250

## 2017-01-01 MED ORDER — FENTANYL CITRATE (PF) 100 MCG/2ML IJ SOLN
50.0000 ug | INTRAMUSCULAR | Status: DC | PRN
  Administered 2017-01-01 – 2017-01-05 (×8): 100 ug via INTRAVENOUS
  Filled 2017-01-01 (×8): qty 2
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
## 2017-01-01 NOTE — Progress Notes (Signed)
Pharmacy Antibiotic Note  Roy Alvarado is a 36 y.o. [facility]. Pharmacy has been consulted for empiric vancomycin and zosyn dosing for fevers and possible pneumonia. Tmax 103.3, WBC wnl, nCrCL ~154 mL/min.   Plan: Vancomycin 2000mg , then 1250mg  IV every 8 hours Zosyn 3.375gm IV every 8 hours Monitor renal function, clinical progress VT as steady state given obesity  Height: 6\' 10"  (208.3 cm) Weight: (!) 319 lb 10.7 oz (145 kg) IBW/kg (Calculated) : 100.6  Temp (24hrs), Avg:102.1 F (38.9 C), Min:100.9 F (38.3 C), Max:103.3 F (39.6 C)   Recent Labs Lab 12/30/16 1017 12/30/16 1344 12/31/16 0222 12/31/16 1050 01/01/17 0256  WBC 10.4  --  17.9* 9.5 10.5  CREATININE 0.65  --  0.63  --  0.68  LATICACIDVEN  --  2.1*  --   --   --     Estimated Creatinine Clearance: 215.8 mL/min (by C-G formula based on SCr of 0.68 mg/dL).    No Known Allergies  Antimicrobials this admission: 3/17 vancomycin >>  3/17 zosyn >>   Dose adjustments this admission: N/A  Microbiology results: Roy Alvarado, PharmD Acute Care Pharmacy Resident  Pager: [phone] 01/01/2017     Thank you for allowing pharmacy to be a part of this patient's care.  Roy Alvarado 01/01/2017 9:03 AM

## 2017-01-01 NOTE — Progress Notes (Signed)
RT note: sputum sample obtained and sent down to main lab without complications. 

## 2017-01-01 NOTE — Progress Notes (Signed)
Subjective: 2 Days Post-Op Procedure(s) (LRB): INTRAMEDULLARY (IM) NAIL FEMORAL (Left) Pt intubated. Discussed case with his nurse. Has been febrile, blood cx ordered, per notes possible pneumonia Receiving vanco and zosyn  Objective: Vital signs in last 24 hours: Temp:  [100 F (37.8 C)-103.3 F (39.6 C)] 100 F (37.8 C) (03/17 0825) Pulse Rate:  [104-134] 131 (03/17 0825) Resp:  [7-44] 25 (03/17 0825) BP: (112-149)/(47-76) 119/48 (03/17 0825) SpO2:  [90 %-100 %] 93 % (03/17 0825) FiO2 (%):  [40 %-50 %] 40 % (03/17 0825)  Intake/Output from previous day: 03/16 0701 - 03/17 0700 In: 3382 [I.V.:3182; IV Piggyback:200] Out: 2530 [Urine:1530; Emesis/NG output:1000] Intake/Output this shift: No intake/output data recorded.   Recent Labs  12/30/16 1017 12/31/16 0222 12/31/16 1050 01/01/17 0256  HGB 12.9* 11.5* 9.7* 10.2*    Recent Labs  12/31/16 1050 01/01/17 0256  WBC 9.5 10.5  RBC 3.58* 3.65*  HCT 30.4* 31.5*  PLT 167 146*    Recent Labs  12/31/16 0222 01/01/17 0256  NA 138 137  K 4.1 3.8  CL 101 99*  CO2 28 31  BUN 13 13  CREATININE 0.63 0.68  GLUCOSE 139* 122*  CALCIUM 8.4* 8.4*    Recent Labs  12/30/16 1017  INR 1.11    Neurologically intact ABD soft Neurovascular intact Intact pulses distally Incision: dressing C/D/I and no drainage No cellulitis present Compartment soft  No sign of DVT  Assessment/Plan: 2 Days Post-Op Procedure(s) (LRB): INTRAMEDULLARY (IM) NAIL FEMORAL (Left) Fever workup in progress will watch cx Continue IV abx dosing per pharmacy When medically stable and able to get up for PT, needs to remain 50% WB LLE Lovenox for DVT ppx  Lizanne Erker M. 01/01/2017, 10:18 AM

## 2017-01-02 ENCOUNTER — Encounter (HOSPITAL_COMMUNITY): Payer: Self-pay | Admitting: General Practice

## 2017-01-02 ENCOUNTER — Inpatient Hospital Stay (HOSPITAL_COMMUNITY): Payer: No Typology Code available for payment source

## 2017-01-02 LAB — BLOOD CULTURE ID PANEL (REFLEXED)
ACINETOBACTER BAUMANNII: NOT DETECTED
CANDIDA ALBICANS: NOT DETECTED
CANDIDA GLABRATA: NOT DETECTED
CANDIDA KRUSEI: NOT DETECTED
CANDIDA PARAPSILOSIS: NOT DETECTED
CANDIDA TROPICALIS: NOT DETECTED
ENTEROBACTERIACEAE SPECIES: NOT DETECTED
ENTEROCOCCUS SPECIES: NOT DETECTED
ESCHERICHIA COLI: NOT DETECTED
Enterobacter cloacae complex: NOT DETECTED
Haemophilus influenzae: DETECTED — AB
KLEBSIELLA OXYTOCA: NOT DETECTED
KLEBSIELLA PNEUMONIAE: NOT DETECTED
LISTERIA MONOCYTOGENES: NOT DETECTED
Neisseria meningitidis: NOT DETECTED
Proteus species: NOT DETECTED
Pseudomonas aeruginosa: NOT DETECTED
STREPTOCOCCUS PYOGENES: NOT DETECTED
Serratia marcescens: NOT DETECTED
Staphylococcus aureus (BCID): NOT DETECTED
Staphylococcus species: NOT DETECTED
Streptococcus agalactiae: NOT DETECTED
Streptococcus pneumoniae: NOT DETECTED
Streptococcus species: NOT DETECTED

## 2017-01-02 LAB — CBC WITH DIFFERENTIAL/PLATELET
BASOS ABS: 0 10*3/uL (ref 0.0–0.1)
BASOS PCT: 0 %
Eosinophils Absolute: 0 10*3/uL (ref 0.0–0.7)
Eosinophils Relative: 1 %
HEMATOCRIT: 26.7 % — AB (ref 39.0–52.0)
HEMOGLOBIN: 8.7 g/dL — AB (ref 13.0–17.0)
LYMPHS PCT: 15 %
Lymphs Abs: 1.2 10*3/uL (ref 0.7–4.0)
MCH: 27.6 pg (ref 26.0–34.0)
MCHC: 32.6 g/dL (ref 30.0–36.0)
MCV: 84.8 fL (ref 78.0–100.0)
MONO ABS: 0.9 10*3/uL (ref 0.1–1.0)
MONOS PCT: 11 %
NEUTROS ABS: 6.1 10*3/uL (ref 1.7–7.7)
NEUTROS PCT: 73 %
Platelets: 142 10*3/uL — ABNORMAL LOW (ref 150–400)
RBC: 3.15 MIL/uL — ABNORMAL LOW (ref 4.22–5.81)
RDW: 14.1 % (ref 11.5–15.5)
WBC: 8.3 10*3/uL (ref 4.0–10.5)

## 2017-01-02 LAB — BASIC METABOLIC PANEL
ANION GAP: 8 (ref 5–15)
BUN: 14 mg/dL (ref 6–20)
CALCIUM: 8.1 mg/dL — AB (ref 8.9–10.3)
CHLORIDE: 97 mmol/L — AB (ref 101–111)
CO2: 30 mmol/L (ref 22–32)
Creatinine, Ser: 0.72 mg/dL (ref 0.61–1.24)
GFR calc non Af Amer: >60 mL/min (ref >60)
GLUCOSE: 118 mg/dL — AB (ref 65–99)
Potassium: 3.8 mmol/L (ref 3.5–5.1)
Sodium: 135 mmol/L (ref 135–145)

## 2017-01-02 LAB — TRIGLYCERIDES: Triglycerides: 264 mg/dL — ABNORMAL HIGH (ref <150)

## 2017-01-02 MED ORDER — SODIUM CHLORIDE 0.9 % IV SOLN
0.0000 mg/h | INTRAVENOUS | Status: DC
  Administered 2017-01-02: 2 mg/h via INTRAVENOUS
  Administered 2017-01-02: 5 mg/h via INTRAVENOUS
  Administered 2017-01-02 – 2017-01-03 (×2): 6 mg/h via INTRAVENOUS
  Filled 2017-01-02 (×4): qty 10

## 2017-01-02 MED ORDER — FENTANYL 2500MCG IN NS 250ML (10MCG/ML) PREMIX INFUSION
0.0000 ug/h | INTRAVENOUS | Status: DC
  Administered 2017-01-02: 300 ug/h via INTRAVENOUS
  Administered 2017-01-02: 350 ug/h via INTRAVENOUS
  Administered 2017-01-02: 200 ug/h via INTRAVENOUS
  Administered 2017-01-03: 350 ug/h via INTRAVENOUS
  Filled 2017-01-02 (×4): qty 250

## 2017-01-02 MED ORDER — PROPOFOL 1000 MG/100ML IV EMUL
5.0000 ug/kg/min | INTRAVENOUS | Status: DC
  Administered 2017-01-02: 70 ug/kg/min via INTRAVENOUS
  Administered 2017-01-02: 60 ug/kg/min via INTRAVENOUS
  Filled 2017-01-02: qty 100

## 2017-01-02 NOTE — Progress Notes (Signed)
PHARMACY - PHYSICIAN COMMUNICATION CRITICAL VALUE ALERT - BLOOD CULTURE IDENTIFICATION (BCID)  Results for orders placed or performed during the hospital encounter of 12/30/16  Blood Culture ID Panel (Reflexed) (Collected: 01/01/2017 11:35 AM)  Result Value Ref Range   Enterococcus species NOT DETECTED NOT DETECTED   Listeria monocytogenes NOT DETECTED NOT DETECTED   Staphylococcus species NOT DETECTED NOT DETECTED   Staphylococcus aureus NOT DETECTED NOT DETECTED   Streptococcus species NOT DETECTED NOT DETECTED   Streptococcus agalactiae NOT DETECTED NOT DETECTED   Streptococcus pneumoniae NOT DETECTED NOT DETECTED   Streptococcus pyogenes NOT DETECTED NOT DETECTED   Acinetobacter baumannii NOT DETECTED NOT DETECTED   Enterobacteriaceae species NOT DETECTED NOT DETECTED   Enterobacter cloacae complex NOT DETECTED NOT DETECTED   Escherichia coli NOT DETECTED NOT DETECTED   Klebsiella oxytoca NOT DETECTED NOT DETECTED   Klebsiella pneumoniae NOT DETECTED NOT DETECTED   Proteus species NOT DETECTED NOT DETECTED   Serratia marcescens NOT DETECTED NOT DETECTED   Haemophilus influenzae DETECTED (A) NOT DETECTED   Neisseria meningitidis NOT DETECTED NOT DETECTED   Pseudomonas aeruginosa NOT DETECTED NOT DETECTED   Candida albicans NOT DETECTED NOT DETECTED   Candida glabrata NOT DETECTED NOT DETECTED   Candida krusei NOT DETECTED NOT DETECTED   Candida parapsilosis NOT DETECTED NOT DETECTED   Candida tropicalis NOT DETECTED NOT DETECTED    Name of physician (or Provider) Contacted: Derrell Lollingamirez  Changes to prescribed antibiotics required: Currently covered with vancomycin + zosyn but recommended to consider narrowing to ceftriaxone or unasyn. MD plans to narrow once blood culture is finalized.   Ben Habermann, Drake LeachRachel Lynn 01/02/2017  10:56 AM

## 2017-01-02 NOTE — Progress Notes (Signed)
RT called to patient room due to patient having a decrease in sats.  On arrival, patient noted to be 86%, patient had increased peak pressures.  Patient taken off of ventilator and bag lavaged.  RT suctioned out copious thick, white, frothy secretions.  Once placed back on vent, patient sats improved to 96%.  RT will continue to monitor.
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
## 2017-01-03 ENCOUNTER — Inpatient Hospital Stay (HOSPITAL_COMMUNITY): Payer: No Typology Code available for payment source

## 2017-01-03 LAB — CULTURE, RESPIRATORY

## 2017-01-03 LAB — POCT I-STAT 3, ART BLOOD GAS (G3+)
Acid-Base Excess: 6 mmol/L — ABNORMAL HIGH (ref 0.0–2.0)
BICARBONATE: 31.5 mmol/L — AB (ref 20.0–28.0)
O2 Saturation: 97 %
TCO2: 33 mmol/L (ref 0–100)
pCO2 arterial: 47.6 mmHg (ref 32.0–48.0)
pH, Arterial: 7.429 (ref 7.350–7.450)
pO2, Arterial: 95 mmHg (ref 83.0–108.0)

## 2017-01-03 LAB — CULTURE, RESPIRATORY W GRAM STAIN: Special Requests: NORMAL

## 2017-01-03 LAB — TRIGLYCERIDES: TRIGLYCERIDES: 386 mg/dL — AB (ref <150)

## 2017-01-03 NOTE — Progress Notes (Signed)
ANTIBIOTIC CONSULT NOTE   Pharmacy Consult for Zosyn Indication: H.Flu bacteremia  No Known Allergies  Patient Measurements: Height: 6\' 10"  (208.3 cm) Weight: (!) 306 lb 10.6 oz (139.1 kg) IBW/kg (Calculated) : 100.6 Adjusted Body Weight:    Vital Signs: Temp: 98.9 F (37.2 C) (03/19 0700) Temp Source: Oral (03/19 0700) BP: 129/54 (03/19 1000) Pulse Rate: 95 (03/19 1000) Intake/Output from previous day: 03/18 0701 - 03/19 0700 In: 4063.8 [I.V.:3173.8; NG/GT:90; IV Piggyback:800] Out: 1690 [Urine:1690] Intake/Output from this shift: Total I/O In: 755.8 [I.V.:475.8; NG/GT:30; IV Piggyback:250] Out: 245 [Urine:245]  Labs:  Recent Labs  01/01/17 0256 01/02/17 0323  WBC 10.5 8.3  HGB 10.2* 8.7*  PLT 146* 142*  CREATININE 0.68 0.72   Estimated Creatinine Clearance: 211.5 mL/min (by C-G formula based on SCr of 0.72 mg/dL). No results for input(s): VANCOTROUGH, VANCOPEAK, VANCORANDOM, GENTTROUGH, GENTPEAK, GENTRANDOM, TOBRATROUGH, TOBRAPEAK, TOBRARND, AMIKACINPEAK, AMIKACINTROU, AMIKACIN in the last 72 hours.   Microbiology:   Medical History: Past Medical History:  Diagnosis Date  . Sleep apnea    Assessment:  D: H flu bacteremia/RLL PNA - D#3. Tmax 100.9 down, wbc wnl, LA 2.1. Scr 0.72  Antimicrobials this admission:  3/17 vanc >> 3/19 3/17 zosyn >>   Microbiology results:  3/17 BCx: GN coccobacilli (BCID H flu) 3/17 Sputum: H. Flu (beta lactamase neg)    Goal of Therapy:  Eradication of infection   Plan:  Zosyn 3.375g IV q8hr. Dose ok down to a CrCl of 20 Pharmacy will sign off. Please reconsult for further dosing assitance.    Nevelyn Mellott S. Merilynn Finlandobertson, PharmD, BCPS Clinical Staff Pharmacist Pager [phone]  Misty Stanleyobertson, Muadh Creasy Stillinger 01/03/2017,10:52 AM

## 2017-01-03 NOTE — Plan of Care (Signed)
Patient remains confused, picking at tubes and trying to get up, restraints continued

## 2017-01-03 NOTE — Progress Notes (Signed)
Patient ID: Kalman JewelsJoshua Zill, male   DOB: 07/13/1981, 36 y.o.   MRN: [id number] Weaning great on 5/5. Pulls 1250cc to command. Extubate now.  Violeta GelinasBurke Carolie Mcilrath, MD, MPH, FACS Trauma: [phone] General Surgery: [phone]

## 2017-01-03 NOTE — Plan of Care (Signed)
Wasted 100 cc fentanyl and 40 cc versed in sink, witnessed by Dole FoodCaroline RN

## 2017-01-03 NOTE — Progress Notes (Signed)
Pt placed on home cpap with home settings. 
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
## 2017-01-03 NOTE — Plan of Care (Signed)
Productive cough with large amount of thick tan secretions, frequent cough, coughs with po fluids, will continue to monitor

## 2017-01-03 NOTE — Procedures (Signed)
Extubation Procedure Note  Patient Details:   Name: Kalman JewelsJoshua Clingenpeel DOB: 05/19/1981 MRN: [id number]   Airway Documentation:     Evaluation  O2 sats: stable throughout Complications: No apparent complications Patient did tolerate procedure well. Bilateral Breath Sounds: Clear, Diminished   Yes   Patient extubated to 2L nasal cannula.  Positive cuff leak noted.  No evidence of stridor.  Patient able to speak post extubation.  Incentive spirometry performed x10 with achieved goal of 800.  Sats currently 98%.  Vitals are stable.  No complications noted.  Durwin GlazeBrown, Loralai Eisman N 01/03/2017, 9:19 AM

## 2017-01-04 ENCOUNTER — Inpatient Hospital Stay (HOSPITAL_COMMUNITY): Payer: No Typology Code available for payment source

## 2017-01-04 DIAGNOSIS — S72302C Unspecified fracture of shaft of left femur, initial encounter for open fracture type IIIA, IIIB, or IIIC: Secondary | ICD-10-CM | POA: Diagnosis not present

## 2017-01-04 DIAGNOSIS — M79652 Pain in left thigh: Secondary | ICD-10-CM | POA: Diagnosis not present

## 2017-01-04 LAB — BASIC METABOLIC PANEL
ANION GAP: 8 (ref 5–15)
BUN: 12 mg/dL (ref 6–20)
CALCIUM: 8.3 mg/dL — AB (ref 8.9–10.3)
CO2: 32 mmol/L (ref 22–32)
Chloride: 97 mmol/L — ABNORMAL LOW (ref 101–111)
Creatinine, Ser: 0.52 mg/dL — ABNORMAL LOW (ref 0.61–1.24)
Glucose, Bld: 94 mg/dL (ref 65–99)
Potassium: 2.9 mmol/L — ABNORMAL LOW (ref 3.5–5.1)
SODIUM: 137 mmol/L (ref 135–145)

## 2017-01-04 LAB — CBC
HCT: 21.1 % — ABNORMAL LOW (ref 39.0–52.0)
HCT: 23.8 % — ABNORMAL LOW (ref 39.0–52.0)
HEMOGLOBIN: 7.6 g/dL — AB (ref 13.0–17.0)
MCH: 27.5 pg (ref 26.0–34.0)
MCH: 26.7 pg (ref 26.0–34.0)
MCHC: 32.5 g/dL (ref 30.0–36.0)
MCHC: 31.9 g/dL (ref 30.0–36.0)
MCV: 84.5 fL (ref 78.0–100.0)
MCV: 83.5 fL (ref 78.0–100.0)
PLATELETS: 223 10*3/uL (ref 150–400)
Platelets: 260 10*3/uL (ref 150–400)
RBC: 2.51 MIL/uL — AB (ref 4.22–5.81)
RBC: 2.85 MIL/uL — AB (ref 4.22–5.81)
RDW: 14.2 % (ref 11.5–15.5)
RDW: 14 % (ref 11.5–15.5)
WBC: 7 10*3/uL (ref 4.0–10.5)
WBC: 7.3 10*3/uL (ref 4.0–10.5)

## 2017-01-04 LAB — PREPARE RBC (CROSSMATCH)

## 2017-01-04 MED ORDER — ALBUTEROL SULFATE (2.5 MG/3ML) 0.083% IN NEBU: 2.5000 mg | INHALATION_SOLUTION | Freq: Four times a day (QID) | RESPIRATORY_TRACT | Status: DC | PRN

## 2017-01-04 MED ORDER — IPRATROPIUM-ALBUTEROL 0.5-2.5 (3) MG/3ML IN SOLN
3.0000 mL | Freq: Four times a day (QID) | RESPIRATORY_TRACT | Status: DC
  Administered 2017-01-04: 3 mL via RESPIRATORY_TRACT
  Filled 2017-01-04: qty 3

## 2017-01-04 MED ORDER — IPRATROPIUM-ALBUTEROL 0.5-2.5 (3) MG/3ML IN SOLN
3.0000 mL | Freq: Three times a day (TID) | RESPIRATORY_TRACT | Status: DC
  Administered 2017-01-04 – 2017-01-06 (×7): 3 mL via RESPIRATORY_TRACT
  Filled 2017-01-04 (×7): qty 3

## 2017-01-04 MED ORDER — SODIUM CHLORIDE 0.9 % IV SOLN: Freq: Once | INTRAVENOUS | Status: DC

## 2017-01-04 MED ORDER — SODIUM CHLORIDE 0.9 % IV SOLN
30.0000 meq | Freq: Once | INTRAVENOUS | Status: AC
  Administered 2017-01-04: 30 meq via INTRAVENOUS
  Filled 2017-01-04: qty 15

## 2017-01-04 MED ORDER — SODIUM CHLORIDE 0.9 % IV SOLN
30.0000 meq | Freq: Once | INTRAVENOUS | Status: AC
  Administered 2017-01-04: 30 meq via INTRAVENOUS
  Filled 2017-01-04 (×2): qty 15

## 2017-01-04 NOTE — Progress Notes (Signed)
CRITICAL VALUE ALERT  Critical value received:  Hgb 6.9/K+ 2.9  Date of notification:  01/04/2017  Time of notification:  0445  Critical value read back:yes  Nurse who received alert:  Alyce PaganAllison Haggard  MD notified (1st page):  Dr. Dwain SarnaWakefield  Time of first page:  0445  MD notified (2nd page):  Time of second page:  Responding MD:    Time MD responded:
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
## 2017-01-05 LAB — CBC
HEMATOCRIT: 21.8 % — AB (ref 39.0–52.0)
HEMOGLOBIN: 7.3 g/dL — AB (ref 13.0–17.0)
MCH: 27.4 pg (ref 26.0–34.0)
MCHC: 33.5 g/dL (ref 30.0–36.0)
MCV: 82 fL (ref 78.0–100.0)
Platelets: 270 10*3/uL (ref 150–400)
RBC: 2.66 MIL/uL — ABNORMAL LOW (ref 4.22–5.81)
RDW: 14.1 % (ref 11.5–15.5)
WBC: 7.9 10*3/uL (ref 4.0–10.5)

## 2017-01-05 LAB — BASIC METABOLIC PANEL
Anion gap: 8 (ref 5–15)
BUN: 7 mg/dL (ref 6–20)
CHLORIDE: 101 mmol/L (ref 101–111)
CO2: 28 mmol/L (ref 22–32)
Calcium: 8.1 mg/dL — ABNORMAL LOW (ref 8.9–10.3)
Creatinine, Ser: 0.58 mg/dL — ABNORMAL LOW (ref 0.61–1.24)
GFR calc non Af Amer: >60 mL/min (ref >60)
GLUCOSE: 109 mg/dL — AB (ref 65–99)
POTASSIUM: 3.1 mmol/L — AB (ref 3.5–5.1)
Sodium: 137 mmol/L (ref 135–145)

## 2017-01-05 LAB — BPAM RBC
Blood Product Expiration Date: 201803282359
ISSUE DATE / TIME: 201803200715
UNIT TYPE AND RH: 600

## 2017-01-05 LAB — TRIGLYCERIDES: Triglycerides: 163 mg/dL — ABNORMAL HIGH (ref <150)

## 2017-01-05 LAB — TYPE AND SCREEN
ABO/RH(D): A NEG
ANTIBODY SCREEN: NEGATIVE
UNIT DIVISION: 0

## 2017-01-05 MED ORDER — ACETAMINOPHEN 325 MG PO TABS
650.0000 mg | ORAL_TABLET | Freq: Four times a day (QID) | ORAL | Status: DC
  Administered 2017-01-05 – 2017-01-07 (×10): 650 mg via ORAL
  Filled 2017-01-05 (×10): qty 2

## 2017-01-05 MED ORDER — ENSURE ENLIVE PO LIQD
237.0000 mL | Freq: Two times a day (BID) | ORAL | Status: DC
  Administered 2017-01-06 – 2017-01-07 (×3): 237 mL via ORAL

## 2017-01-05 MED ORDER — METHOCARBAMOL 750 MG PO TABS
750.0000 mg | ORAL_TABLET | Freq: Three times a day (TID) | ORAL | Status: DC
  Administered 2017-01-05 – 2017-01-07 (×8): 750 mg via ORAL
  Filled 2017-01-05 (×2): qty 2
  Filled 2017-01-05 (×4): qty 1
  Filled 2017-01-05: qty 2
  Filled 2017-01-05: qty 1

## 2017-01-05 MED ORDER — ADULT MULTIVITAMIN W/MINERALS CH
1.0000 | ORAL_TABLET | Freq: Every day | ORAL | Status: DC
  Administered 2017-01-05 – 2017-01-07 (×3): 1 via ORAL
  Filled 2017-01-05 (×3): qty 1

## 2017-01-05 MED ORDER — OXYCODONE HCL 5 MG PO TABS
10.0000 mg | ORAL_TABLET | ORAL | Status: DC | PRN
  Administered 2017-01-05 (×2): 10 mg via ORAL
  Administered 2017-01-06: 15 mg via ORAL
  Administered 2017-01-06: 20 mg via ORAL
  Administered 2017-01-06: 15 mg via ORAL
  Administered 2017-01-06: 10 mg via ORAL
  Administered 2017-01-06: 15 mg via ORAL
  Administered 2017-01-06: 10 mg via ORAL
  Administered 2017-01-07: 20 mg via ORAL
  Administered 2017-01-07 (×2): 15 mg via ORAL
  Administered 2017-01-07: 20 mg via ORAL
  Filled 2017-01-05: qty 4
  Filled 2017-01-05: qty 3
  Filled 2017-01-05: qty 4
  Filled 2017-01-05: qty 3
  Filled 2017-01-05: qty 2
  Filled 2017-01-05: qty 3
  Filled 2017-01-05: qty 2
  Filled 2017-01-05: qty 3
  Filled 2017-01-05 (×2): qty 2
  Filled 2017-01-05: qty 3
  Filled 2017-01-05: qty 4

## 2017-01-05 MED ORDER — HYDROMORPHONE HCL 1 MG/ML IJ SOLN
1.0000 mg | INTRAMUSCULAR | Status: DC | PRN
  Administered 2017-01-05: 1 mg via INTRAVENOUS
  Filled 2017-01-05: qty 1

## 2017-01-05 MED ORDER — FERROUS GLUCONATE 324 (38 FE) MG PO TABS
324.0000 mg | ORAL_TABLET | Freq: Two times a day (BID) | ORAL | Status: DC
  Administered 2017-01-05 – 2017-01-07 (×4): 324 mg via ORAL
  Filled 2017-01-05 (×6): qty 1

## 2017-01-05 MED ORDER — CEFTRIAXONE SODIUM 2 G IJ SOLR
2.0000 g | INTRAMUSCULAR | Status: DC
  Administered 2017-01-05 – 2017-01-06 (×2): 2 g via INTRAVENOUS
  Filled 2017-01-05 (×4): qty 2

## 2017-01-05 MED ORDER — POTASSIUM CHLORIDE CRYS ER 20 MEQ PO TBCR
20.0000 meq | EXTENDED_RELEASE_TABLET | Freq: Two times a day (BID) | ORAL | Status: DC
  Administered 2017-01-05 – 2017-01-07 (×5): 20 meq via ORAL
  Filled 2017-01-05 (×5): qty 1
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
## 2017-01-05 NOTE — Progress Notes (Signed)
Patient has home CPAP and stated he would place it on when he is ready to go to bed. RT advised patient to call if he needs assistance. RT will monitor as needed.

## 2017-01-05 NOTE — Progress Notes (Addendum)
ANTIBIOTIC CONSULT NOTE   Pharmacy Consult:  Zosyn discontinued 01/05/17 and switch to Ceftriaxone  Indication: H.Flu bacteremia  No Known Allergies  Patient Measurements: Height: 6\' 10"  (208.3 cm) Weight: (!) 306 lb 10.6 oz (139.1 kg) IBW/kg (Calculated) : 100.6 Adjusted Body Weight:    Vital Signs: Temp: 98.5 F (36.9 C) (03/21 1221) Temp Source: Oral (03/21 1221) BP: 126/61 (03/21 1400) Pulse Rate: 102 (03/21 1400) Intake/Output from previous day: 03/20 0701 - 03/21 0700 In: 3625 [P.O.:1560; I.V.:1325; Blood:350; IV Piggyback:390] Out: 4035 [Urine:4035] Intake/Output from this shift: Total I/O In: 490 [P.O.:240; I.V.:200; IV Piggyback:50] Out: 1450 [Urine:1450]  Labs:  Recent Labs  01/04/17 0229 01/04/17 1231 01/05/17 0230  WBC 7.0 7.3 7.9  HGB REPEATED TO VERIFY 7.6* 7.3*  PLT 223 260 270  CREATININE 0.52*  --  0.58*   Estimated Creatinine Clearance: 211.5 mL/min (A) (by C-G formula based on SCr of 0.58 mg/dL (L)). No results for input(s): VANCOTROUGH, VANCOPEAK, VANCORANDOM, GENTTROUGH, GENTPEAK, GENTRANDOM, TOBRATROUGH, TOBRAPEAK, TOBRARND, AMIKACINPEAK, AMIKACINTROU, AMIKACIN in the last 72 hours.   Microbiology:   Medical History: Past Medical History:  Diagnosis Date  . Sleep apnea    Assessment: 36 y.o male with  H flu bacteremia/RLL PNA - D#5 on Zosyn.  Afebrile, Tmax 99.9 in last 48 hours, WBC remains wn. Scr 0.58, nCrCL>100, I/O 3612/3735, good UOP Zosyn narrowed to Ceftriaxone.   Antimicrobials this admission:  3/17 vanc >> 3/19 3/17 zosyn >>   Microbiology results:  3/17 BCx: GN coccobacilli (BCID H flu) 3/17 BCx: ngtd x4 days  3/17 Sputum: H. Flu (beta lactamase neg)   Goal of Therapy:  Eradication of infection  Plan:  Zosyn discontinued by MD. Narrowed to Ceftriaxone 2 g IV q24h  Pharmacy will sign off. Please reconsult for further dosing assitance.  Noah Delaineuth Nikea Settle, RPh Clinical Pharmacist Pager: [phone]  [phone] [facility] [phone] 01/05/2017,2:36 PM
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
## 2017-01-05 NOTE — Progress Notes (Signed)
Inpatient Rehabilitation  Per PT and OT request, patient was screened by Temica Righetti for appropriateness for an Inpatient Acute Rehab consult.  At this time we are recommending an Inpatient Rehab consult.  Please order if you are agreeable.    Zain Lankford, M.A., CCC/SLP Admission Coordinator  [facility]  Cell [phone]  
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
## 2017-01-05 NOTE — Progress Notes (Signed)
Trauma Service Note  Subjective: Patient is awake alert and oriented.  He does not need to be on droplet precautions for his H. Influenza pneumonia.  This is not a viral pneumonia  Objective: Vital signs in last 24 hours: Temp:  [98.4 F (36.9 C)-99.5 F (37.5 C)] 99.5 F (37.5 C) (03/21 0816) Pulse Rate:  [87-105] 102 (03/21 0800) Resp:  [12-27] 23 (03/21 0800) BP: (110-149)/(48-82) 133/63 (03/21 0800) SpO2:  [93 %-100 %] 95 % (03/21 0805) Last BM Date:  (prior to admission)  Intake/Output from previous day: 03/20 0701 - 03/21 0700 In: 3625 [P.O.:1560; I.V.:1325; Blood:350; IV Piggyback:390] Out: 4035 [Urine:4035] Intake/Output this shift: Total I/O In: -  Out: 1125 [Urine:1125]  General: No distress.  Lungs: clear  Abd: Benign  Extremities: Left leg a bit more swollen than the right  Neuro: Intact  Lab Results: CBC   Recent Labs  01/04/17 1231 01/05/17 0230  WBC 7.3 7.9  HGB 7.6* 7.3*  HCT 23.8* 21.8*  PLT 260 270   BMET  Recent Labs  01/04/17 0229 01/05/17 0230  NA 137 137  K 2.9* 3.1*  CL 97* 101  CO2 32 28  GLUCOSE 94 109*  BUN 12 7  CREATININE 0.52* 0.58*  CALCIUM 8.3* 8.1*   PT/INR No results for input(s): LABPROT, INR in the last 72 hours. ABG  Recent Labs  01/03/17 0521  PHART 7.429  HCO3 31.5*    Studies/Results: Dg Chest Port 1 View  Result Date: 01/04/2017 CLINICAL DATA:  Respiratory failure EXAM: PORTABLE CHEST 1 VIEW COMPARISON:  01/03/2017 FINDINGS: Cardiac shadow is stable. The endotracheal tube and nasogastric catheter have been removed in the interval. Persistent dense consolidation in the right lower lobe is noted. Mild changes in the left base are again seen and stable. No new focal abnormality is noted. IMPRESSION: No change from the previous day aside from interval tube removal. Electronically Signed   By: Alcide Clever M.D.   On: 01/04/2017 08:03    Anti-infectives: Anti-infectives    Start     Dose/Rate Route  Frequency Ordered Stop   01/01/17 1730  vancomycin (VANCOCIN) 1,250 mg in sodium chloride 0.9 % 250 mL IVPB  Status:  Discontinued     1,250 mg 166.7 mL/hr over 90 Minutes Intravenous Every 8 hours 01/01/17 0910 01/03/17 0907   01/01/17 0930  vancomycin (VANCOCIN) 2,000 mg in sodium chloride 0.9 % 500 mL IVPB     2,000 mg 250 mL/hr over 120 Minutes Intravenous  Once 01/01/17 0910 01/01/17 1151   01/01/17 0915  piperacillin-tazobactam (ZOSYN) IVPB 3.375 g     3.375 g 12.5 mL/hr over 240 Minutes Intravenous Every 8 hours 01/01/17 0910     12/31/16 0600  ceFAZolin (ANCEF) IVPB 2g/100 mL premix     2 g 200 mL/hr over 30 Minutes Intravenous To Surgery 12/30/16 1554 12/30/16 1811   12/30/16 2230  ceFAZolin (ANCEF) IVPB 2g/100 mL premix  Status:  Discontinued     2 g 200 mL/hr over 30 Minutes Intravenous Every 6 hours 12/30/16 2225 12/30/16 2227   12/30/16 2200  ceFAZolin (ANCEF) IVPB 1 g/50 mL premix  Status:  Discontinued     1 g 100 mL/hr over 30 Minutes Intravenous Every 8 hours 12/30/16 1553 01/01/17 0829   12/30/16 1045  ceFAZolin (ANCEF) IVPB 2g/100 mL premix     2 g 200 mL/hr over 30 Minutes Intravenous  Once 12/30/16 1038 12/30/16 1318      Assessment/Plan: s/p Procedure(s): INTRAMEDULLARY (IM)  NAIL FEMORAL Advance diet Transfer to the floor  COntinue antibiotics  LOS: 6 days   Marta LamasJames O. Gae BonWyatt, III, MD, FACS [phone] Trauma Surgeon 01/05/2017

## 2017-01-06 DIAGNOSIS — S060X9A Concussion with loss of consciousness of unspecified duration, initial encounter: Secondary | ICD-10-CM | POA: Diagnosis not present

## 2017-01-06 DIAGNOSIS — S72065S Nondisplaced articular fracture of head of left femur, sequela: Secondary | ICD-10-CM | POA: Diagnosis not present

## 2017-01-06 LAB — BASIC METABOLIC PANEL
Anion gap: 9 (ref 5–15)
BUN: 8 mg/dL (ref 6–20)
CO2: 24 mmol/L (ref 22–32)
CREATININE: 0.55 mg/dL — AB (ref 0.61–1.24)
Calcium: 8.3 mg/dL — ABNORMAL LOW (ref 8.9–10.3)
Chloride: 106 mmol/L (ref 101–111)
GFR calc Af Amer: >60 mL/min (ref >60)
Glucose, Bld: 114 mg/dL — ABNORMAL HIGH (ref 65–99)
Potassium: 3.6 mmol/L (ref 3.5–5.1)
SODIUM: 139 mmol/L (ref 135–145)

## 2017-01-06 LAB — CBC
HEMATOCRIT: 24.5 % — AB (ref 39.0–52.0)
Hemoglobin: 8.1 g/dL — ABNORMAL LOW (ref 13.0–17.0)
MCH: 27.8 pg (ref 26.0–34.0)
MCHC: 33.1 g/dL (ref 30.0–36.0)
MCV: 84.2 fL (ref 78.0–100.0)
Platelets: 364 10*3/uL (ref 150–400)
RBC: 2.91 MIL/uL — ABNORMAL LOW (ref 4.22–5.81)
RDW: 15.2 % (ref 11.5–15.5)
WBC: 7.2 10*3/uL (ref 4.0–10.5)

## 2017-01-06 LAB — CULTURE, BLOOD (ROUTINE X 2): Culture: NO GROWTH

## 2017-01-06 LAB — TRIGLYCERIDES: TRIGLYCERIDES: 114 mg/dL (ref <150)

## 2017-01-06 MED ORDER — HYDROMORPHONE HCL 2 MG/ML IJ SOLN: 1.0000 mg | INTRAMUSCULAR | Status: DC | PRN

## 2017-01-06 NOTE — Progress Notes (Signed)
Pt has home unit CPAP pt states that he doesn't need any assistance with the device, pt is stable at this time no distress noted. Taking neb.
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
## 2017-01-06 NOTE — Progress Notes (Signed)
Inpatient Rehabilitation  Met with patient to discuss team's recommendation for IP Rehab.  Shared booklets and answered questions.  Patient eager to regain his independence.  Have initiated insurance authorization.  Plan to follow along for timing of medical readiness, insurance authorization, and bed availability.  Please call with questions.   Carmelia Roller., CCC/SLP Admission Coordinator  [place]  Cell [phone]

## 2017-01-06 NOTE — Progress Notes (Signed)
OT Cancellation Note  Patient Details Name: Roy Alvarado MRN: [id number] DOB: 03/08/1981   Cancelled Treatment:    Reason Eval/Treat Not Completed: Other (comment). Pt reports just recently back to bed and that he is worn out from being up as well as from coughing so much. Made him aware we may not be able to be back to see him today, but would be back tomorrow.  Evette GeorgesLeonard, Ramelo Oetken Eva [phone] 01/06/2017, 2:06 PM
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
## 2017-01-07 LAB — BASIC METABOLIC PANEL
ANION GAP: 10 (ref 5–15)
BUN: 12 mg/dL (ref 6–20)
CO2: 23 mmol/L (ref 22–32)
CREATININE: 0.55 mg/dL — AB (ref 0.61–1.24)
Calcium: 8.7 mg/dL — ABNORMAL LOW (ref 8.9–10.3)
Chloride: 104 mmol/L (ref 101–111)
GFR calc non Af Amer: >60 mL/min (ref >60)
Glucose, Bld: 117 mg/dL — ABNORMAL HIGH (ref 65–99)
Potassium: 4 mmol/L (ref 3.5–5.1)
SODIUM: 137 mmol/L (ref 135–145)

## 2017-01-07 LAB — CBC
HCT: 26.3 % — ABNORMAL LOW (ref 39.0–52.0)
Hemoglobin: 8.6 g/dL — ABNORMAL LOW (ref 13.0–17.0)
MCH: 27.5 pg (ref 26.0–34.0)
MCHC: 32.7 g/dL (ref 30.0–36.0)
MCV: 84 fL (ref 78.0–100.0)
Platelets: 423 10*3/uL — ABNORMAL HIGH (ref 150–400)
RBC: 3.13 MIL/uL — AB (ref 4.22–5.81)
RDW: 15.3 % (ref 11.5–15.5)
WBC: 7.5 10*3/uL (ref 4.0–10.5)

## 2017-01-07 MED ORDER — ACETAMINOPHEN 325 MG PO TABS: 650.0000 mg | ORAL_TABLET | Freq: Four times a day (QID) | ORAL | Status: AC

## 2017-01-07 MED ORDER — IPRATROPIUM-ALBUTEROL 0.5-2.5 (3) MG/3ML IN SOLN
3.0000 mL | Freq: Two times a day (BID) | RESPIRATORY_TRACT | Status: DC
  Administered 2017-01-07: 3 mL via RESPIRATORY_TRACT
  Filled 2017-01-07: qty 3

## 2017-01-07 MED ORDER — METHOCARBAMOL 750 MG PO TABS: 750.0000 mg | ORAL_TABLET | Freq: Three times a day (TID) | ORAL | 0 refills | Status: AC | PRN

## 2017-01-07 MED ORDER — ADULT MULTIVITAMIN W/MINERALS CH: 1.0000 | ORAL_TABLET | Freq: Every day | ORAL | Status: AC

## 2017-01-07 MED ORDER — OXYCODONE HCL 10 MG PO TABS: 10.0000 mg | ORAL_TABLET | ORAL | 0 refills | Status: AC | PRN
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
## 2017-01-07 NOTE — Discharge Instructions (Signed)
50% weight bearing on left lower extremity  Follow up with orthopedic surgery in 2 weeks for suture removal.

## 2017-01-07 NOTE — Progress Notes (Signed)
1630pm Notified by [facility] that [employer] has authorized HH[facility].  Faxed demographics, orders and clinical information to Clydie BraunKaren at [facility], fax# [phone].    Quintella BatonJulie W. Aarya Robinson, RN, BSN  Trauma/Neuro ICU Case Manager [phone]
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
## 2017-01-18 LAB — CULTURE, BLOOD (ROUTINE X 2)

## 2017-03-29 ENCOUNTER — Other Ambulatory Visit: Payer: Self-pay | Admitting: Orthopedic Surgery

## 2017-03-29 DIAGNOSIS — M2392 Unspecified internal derangement of left knee: Secondary | ICD-10-CM

## 2017-03-29 DIAGNOSIS — S72352K Displaced comminuted fracture of shaft of left femur, subsequent encounter for closed fracture with nonunion: Secondary | ICD-10-CM

## 2017-04-06 ENCOUNTER — Ambulatory Visit
Admission: RE | Admit: 2017-04-06 | Discharge: 2017-04-06 | Disposition: A | Discharge status: Home / Self Care | Payer: 59 | Source: Ambulatory Visit | Attending: Orthopedic Surgery | Admitting: Orthopedic Surgery

## 2017-04-06 DIAGNOSIS — M2392 Unspecified internal derangement of left knee: Secondary | ICD-10-CM

## 2017-04-06 DIAGNOSIS — S72352K Displaced comminuted fracture of shaft of left femur, subsequent encounter for closed fracture with nonunion: Secondary | ICD-10-CM
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
## 2021-02-15 DEATH — deceased
